# Patient Record
Sex: Female | Born: 2000 | Race: White | Hispanic: No | Marital: Single | Smoking: Never smoker
Health system: Southern US, Community
[De-identification: ages and names within clinical notes are randomized; demographics above are authoritative.]

## PROBLEM LIST (undated history)

## (undated) HISTORY — PX: TONSILLECTOMY: SUR1361

---
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
## 2020-02-22 ENCOUNTER — Encounter (HOSPITAL_BASED_OUTPATIENT_CLINIC_OR_DEPARTMENT_OTHER): Payer: Self-pay | Admitting: *Deleted

## 2020-02-22 ENCOUNTER — Emergency Department (HOSPITAL_BASED_OUTPATIENT_CLINIC_OR_DEPARTMENT_OTHER)
Admission: EM | Admit: 2020-02-22 | Discharge: 2020-02-22 | Disposition: A | Discharge status: Home / Self Care | Payer: Medicaid Other | Attending: Emergency Medicine | Admitting: Emergency Medicine

## 2020-02-22 ENCOUNTER — Other Ambulatory Visit: Payer: Self-pay

## 2020-02-22 DIAGNOSIS — R1031 Right lower quadrant pain: Secondary | ICD-10-CM | POA: Diagnosis present

## 2020-02-22 DIAGNOSIS — N10 Acute tubulo-interstitial nephritis: Secondary | ICD-10-CM | POA: Diagnosis not present

## 2020-02-22 DIAGNOSIS — R109 Unspecified abdominal pain: Secondary | ICD-10-CM

## 2020-02-22 DIAGNOSIS — N12 Tubulo-interstitial nephritis, not specified as acute or chronic: Secondary | ICD-10-CM

## 2020-02-22 LAB — URINALYSIS, ROUTINE W REFLEX MICROSCOPIC
Bilirubin Urine: NEGATIVE
Glucose, UA: NEGATIVE mg/dL
Ketones, ur: NEGATIVE mg/dL
Nitrite: NEGATIVE
Protein, ur: 30 mg/dL — AB
Specific Gravity, Urine: 1.02 (ref 1.005–1.030)
pH: 6 (ref 5.0–8.0)

## 2020-02-22 LAB — URINALYSIS, MICROSCOPIC (REFLEX): WBC, UA: >50 WBC/hpf (ref 0–5)

## 2020-02-22 LAB — PREGNANCY, URINE: Preg Test, Ur: NEGATIVE

## 2020-02-22 MED ORDER — ONDANSETRON 4 MG PO TBDP: 4.0000 mg | ORAL_TABLET | Freq: Three times a day (TID) | ORAL | 0 refills | Status: AC | PRN

## 2020-02-22 MED ORDER — PHENAZOPYRIDINE HCL 200 MG PO TABS: 200.0000 mg | ORAL_TABLET | Freq: Three times a day (TID) | ORAL | 0 refills | Status: AC

## 2020-02-22 MED ORDER — SULFAMETHOXAZOLE-TRIMETHOPRIM 800-160 MG PO TABS: 1.0000 | ORAL_TABLET | Freq: Two times a day (BID) | ORAL | 0 refills | Status: AC

## 2020-02-22 NOTE — ED Triage Notes (Signed)
Pt c/o right flank pain x 2 days , UTI x 1 week ago untreated

## 2020-02-22 NOTE — ED Provider Notes (Signed)
[facility] EMERGENCY DEPARTMENT Provider Note   CSN: [id number] Arrival date & time: 02/22/20  1359     History Chief Complaint  Patient presents with  . Flank Pain    Dawn Marquez is a 19 y.o. female.  HPI  Patient is a 19 year old female with a past medical history is significant for frequent urinary tract infections.  She states that the past year she has quite a few, potentially as many as once a month.  She states she does not always need antibiotics but is sometimes able to clear them on her own however she states that she had UTI-like symptoms 1 week ago but was never treated with antibiotics.  She states that approximately 2 days ago she started having right-sided back pain.  She denies any fevers, chills, nausea, vomiting, lightheadedness, malaise.  She states she is otherwise feeling well.  Patient states she has had no hesitancy of your urine.  She states that she was recently on her.  Approximately 2 days ago was the end of her period.  She denies any spotting.    History reviewed. No pertinent past medical history.  Patient Active Problem List   Diagnosis Date Noted  . Left knee injury 07/03/2015    Past Surgical History:  Procedure Laterality Date  . TONSILLECTOMY       OB History   No obstetric history on file.     History reviewed. No pertinent family history.  Social History   Tobacco Use  . Smoking status: Never Smoker  . Smokeless tobacco: Never Used  Substance Use Topics  . Alcohol use: Not Currently    Alcohol/week: 0.0 standard drinks  . Drug use: Yes    Types: Marijuana    Home Medications Prior to Admission medications   Medication Sig Start Date End Date Taking? Authorizing Provider  ibuprofen (ADVIL,MOTRIN) 800 MG tablet Take 1 tablet (800 mg total) by mouth 3 (three) times daily. With food 06/29/15   Muthersbaugh, Dahlia Client, PA-C  ondansetron (ZOFRAN ODT) 4 MG disintegrating tablet Take 1 tablet (4 mg total) by mouth every  8 (eight) hours as needed for nausea or vomiting. 02/22/20   Gailen Shelter, PA  phenazopyridine (PYRIDIUM) 200 MG tablet Take 1 tablet (200 mg total) by mouth 3 (three) times daily. 02/22/20   Gailen Shelter, PA  sulfamethoxazole-trimethoprim (BACTRIM DS) 800-160 MG tablet Take 1 tablet by mouth 2 (two) times daily for 14 days. 02/22/20 03/07/20  Gailen Shelter, PA    Allergies    Patient has no known allergies.  Review of Systems   Review of Systems  Constitutional: Negative for appetite change, chills, fatigue and fever.  HENT: Negative for congestion.   Eyes: Negative for pain.  Respiratory: Negative for cough and shortness of breath.   Cardiovascular: Negative for chest pain and leg swelling.  Gastrointestinal: Negative for abdominal pain, constipation, diarrhea, nausea and vomiting.  Genitourinary: Positive for dysuria, frequency and hematuria. Negative for pelvic pain, urgency, vaginal bleeding, vaginal discharge and vaginal pain.  Musculoskeletal: Negative for myalgias.  Skin: Negative for rash.  Neurological: Negative for dizziness and headaches.    Physical Exam Updated Vital Signs BP 112/78 (BP Location: Right Arm)   Pulse 78   Temp 98.9 F (37.2 C) (Oral)   Resp 20   Ht 5\' 2"  (1.575 m)   Wt 54.4 kg   LMP 02/13/2020   SpO2 99%   BMI 21.95 kg/m   Physical Exam Vitals and nursing note reviewed.  Constitutional:      General: She is not in acute distress.    Comments: Patient is well-appearing 19 year old female no acute distress.  HENT:     Head: Normocephalic and atraumatic.     Nose: Nose normal.  Eyes:     General: No scleral icterus. Cardiovascular:     Rate and Rhythm: Normal rate and regular rhythm.     Pulses: Normal pulses.     Heart sounds: Normal heart sounds.  Pulmonary:     Effort: Pulmonary effort is normal. No respiratory distress.     Breath sounds: No wheezing.  Abdominal:     Palpations: Abdomen is soft.     Tenderness: There is no  abdominal tenderness. There is right CVA tenderness. There is no left CVA tenderness, guarding or rebound.     Comments: Negative McBurney, Rovsing, Murphy.  Musculoskeletal:     Cervical back: Normal range of motion.     Right lower leg: No edema.     Left lower leg: No edema.  Skin:    General: Skin is warm and dry.     Capillary Refill: Capillary refill takes less than 2 seconds.  Neurological:     Mental Status: She is alert. Mental status is at baseline.  Psychiatric:        Mood and Affect: Mood normal.        Behavior: Behavior normal.     ED Results / Procedures / Treatments   Labs (all labs ordered are listed, but only abnormal results are displayed) Labs Reviewed  URINALYSIS, ROUTINE W REFLEX MICROSCOPIC - Abnormal; Notable for the following components:      Result Value   APPearance CLOUDY (*)    Hgb urine dipstick LARGE (*)    Protein, ur 30 (*)    Leukocytes,Ua MODERATE (*)    All other components within normal limits  URINALYSIS, MICROSCOPIC (REFLEX) - Abnormal; Notable for the following components:   Bacteria, UA MANY (*)    All other components within normal limits  URINE CULTURE  PREGNANCY, URINE    EKG None  Radiology No results found.  Procedures Ultrasound ED Renal  Date/Time: 02/22/2020 3:31 PM Performed by: Gailen Shelter, PA Authorized by: Gailen Shelter, PA   Procedure details:    Indications: hydronephrosis and urinary tract infection     Technique:  R kidney and L kidneyImages: archived Left kidney findings:    Mass: not identified     Nephrolithiasis: not identified     Renal stones: not identified     Hydronephrosis: none   Right kidney findings:    Mass: not identified     Nephrolithiasis: not identified     Renal stones: not identified     (including critical care time)  Medications Ordered in ED Medications - No data to display  ED Course  I have reviewed the triage vital signs and the nursing notes.  Pertinent  labs & imaging results that were available during my care of the patient were reviewed by me and considered in my medical decision making (see chart for details).    MDM Rules/Calculators/A&P                      Patient is 19 year old female with no past medical history other than frequent UTIs.  She is presented today with history as discussed above.  She has symptoms consistent with urinary tract infection which include frequency, dysuria, hematuria, and some pelvic fullness.  She denies any abdominal pain.  Specifically denies any decreased appetite, right lower quadrant Donnell pain, abdominal pain, no nausea, vomiting.  Given patient's history and physical exam I strongly suspect pyelonephritis.  Will conduct bedside ultrasound with Dr. Darl Householder to assess for any hydronephrosis which would evidence infected kidney stone.  I considered and specifically doubt appendicitis, ovarian torsion, ectopic pregnancy.  I discussed all these possibilities with patient she is understanding that these emergent medical diagnoses cannot be excluded without pelvic exam and CT scan/possible ultrasound.  She prefer to not have this done at this time.  She would like to defer pelvic exam and be discharged with pyelonephritis treatment and Pyridium for treatment control.  Also Zofran in case she has any nausea.  Also shows many bacteria, cloudy appearance of urine with large hemoglobin and leukocytes moderate.  Vital signs are within normal notes on my assessment.  She was initially tachycardic in triage however heart rate is 80 on my exam.  78 on repeat by nursing staff.    [place] conducted at bedside with Dr. Darl Householder.  Bilateral kidneys with no hydronephrosis.   ----------- The medical records were personally reviewed by myself. I personally reviewed all lab results and interpreted all imaging studies and either concurred with their official read or contacted radiology for clarification. Additional history obtained from  old record  This patient appears reasonably screened and I doubt any other medical condition requiring further workup, evaluation, or treatment in the ED at this time prior to discharge.   Patient's vitals are WNL apart from vital sign abnormalities discussed above, patient is in NAD, and able to ambulate in the ED at their baseline and able to tolerate PO.  Pain has been managed or a plan has been made for home management and has no complaints prior to discharge. Patient is comfortable with above plan and for discharge at this time. All questions were answered prior to disposition. Results from the ER workup discussed with the patient face to face and all questions answered to the best of my ability. The patient is safe for discharge with strict return precautions. Patient appears safe for discharge with appropriate follow-up. Conveyed my impression with the patient and they voiced understanding and are agreeable to plan.   An After Visit Summary was printed and given to the patient.  Portions of this note were generated with [employer]. Dictation errors may occur despite best attempts at proofreading.    Final Clinical Impression(s) / ED Diagnoses Final diagnoses:  Flank pain  Pyelonephritis    Rx / DC Orders ED Discharge Orders         Ordered    sulfamethoxazole-trimethoprim (BACTRIM DS) 800-160 MG tablet  2 times daily     02/22/20 1516    phenazopyridine (PYRIDIUM) 200 MG tablet  3 times daily     02/22/20 1516    ondansetron (ZOFRAN ODT) 4 MG disintegrating tablet  Every 8 hours PRN     02/22/20 1516           Tedd Sias, [place] 02/22/20 1533    Drenda Freeze, MD 02/24/20 1455

## 2020-02-22 NOTE — ED Notes (Signed)
Pt verbalized understanding of dc instructions.

## 2020-02-22 NOTE — Discharge Instructions (Signed)
Please follow-up with your primary care doctor.  As you been having frequent urinary tract infections I believe you would benefit from evaluation by your primary care doctor.  Please take entire course of Bactrim antibiotics.  This is been prescribed for 14 days.  Please use over-the-counter probiotics.  Please use Pyridium for your symptoms.  He may use Zofran as needed for nausea.  I have printed this on a separate prescription in case you have any nausea need to fill it.

## 2020-02-22 NOTE — ED Notes (Signed)
Pt denies nausea given ginger ale

## 2020-02-25 LAB — URINE CULTURE: Culture: >=100000 — AB

## 2020-02-26 ENCOUNTER — Telehealth: Payer: Self-pay | Admitting: *Deleted

## 2020-02-26 NOTE — Telephone Encounter (Signed)
Post ED Visit - Positive Culture Follow-up  Culture report reviewed by antimicrobial stewardship pharmacist: [facility] []  , Pharm.D. []  Enzo Bi, Pharm.D., BCPS AQ-ID []  , Pharm.D., BCPS []  Celedonio Miyamoto, .D., BCPS []  [place], .D., BCPS, AAHIVP []  Georgina Pillion, Pharm.D., BCPS, AAHIVP []  [address], PharmD, BCPS []  , PharmD, BCPS []  [place], PharmD, BCPS []  [address], PharmD []  , PharmD, BCPS [x]  Estella Husk, PharmD  Pharmacy Team []  Lysle Pearl, PharmD []  , PharmD []  Phillips Climes, PharmD []  , Rph []  Agapito Games) , PharmD []  Verlan Friends, PharmD []  , PharmD []  Mervyn Gay, PharmD []  , PharmD []  Margo Common, PharmD []  [facility], PharmD []  , PharmD []  Len Childs, PharmD   Positive urine culture Treated with Sulfamethoxazole-Trimethoprim, organism sensitive to the same and no further patient follow-up is required at this time.  [facility] 02/26/2020, 11:50 AM
